# Patient Record
Sex: Male | Born: 1985
Health system: Southern US, Community
[De-identification: ages and names within clinical notes are randomized; demographics above are authoritative.]

## PROBLEM LIST (undated history)

## (undated) DIAGNOSIS — F410 Panic disorder [episodic paroxysmal anxiety] without agoraphobia: Secondary | ICD-10-CM

## (undated) HISTORY — PX: FRACTURE SURGERY: SHX138

## (undated) HISTORY — PX: TYMPANOSTOMY TUBE PLACEMENT: SHX32

## (undated) HISTORY — PX: RHINOPLASTY: SUR1284

---
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
## 2011-03-14 ENCOUNTER — Ambulatory Visit (INDEPENDENT_AMBULATORY_CARE_PROVIDER_SITE_OTHER): Payer: BC Managed Care – PPO | Admitting: Family Medicine

## 2011-03-14 ENCOUNTER — Encounter: Payer: Self-pay | Admitting: Family Medicine

## 2011-03-14 DIAGNOSIS — J069 Acute upper respiratory infection, unspecified: Secondary | ICD-10-CM

## 2011-03-14 MED ORDER — AZITHROMYCIN 250 MG PO TABS: ORAL_TABLET | ORAL | Status: DC

## 2011-03-14 MED ORDER — AZITHROMYCIN 250 MG PO TABS: ORAL_TABLET | ORAL | Status: AC

## 2011-03-14 NOTE — Assessment & Plan Note (Signed)
Discussed supportive care to include antihistamine with decongestant, nasal saline rinse for cough which seems to be triggered by postnasal drainage. We discussed that this is likely viral in etiology and is not likely to be helped by antibiotics at this point. Gave him a prescription for azithromycin to hold on to and if his symptoms worsen or duration is longer than 7-10 days may fill at that time.  Patient not a candidate for flu vaccine do to history of chicken allergy which he was told precluded him from getting a flu shot due to anaphylaxis

## 2011-03-14 NOTE — Patient Instructions (Signed)
Pseudoephedrine for decongestant Zyrtec for allergy Nasal saline rinse  Reserve azithromycin if no improvement.  Your upper respiratory is likely caused by a virus that doesn't respond to antibiotics- it will run its course in 1 week.

## 2011-03-14 NOTE — Progress Notes (Signed)
  Subjective:    Patient ID: Crixus Mcaulay, male    DOB: 11/10/1985, 25 y.o.   MRN: [id number]  HPI 25 year old new patient seen as a work in for acute URI  2 day history of fever of 103, rhinorrhea, cough.  Has been going to work without significant fatigue or myalgias. Cough has some element of postnasal drainage and feels he has some chest congestion. No dyspnea. + mild sore throat when coughing.  Wife had same illness several days ago.  Past medical history significant for asthma as a child but has not needed any treatment since adulthood Review of SystemsGeneral:  Negative for fever, chills, malaise, myalgias HEENT: Negative for conjunctivitis, ear pain or drainage, rhinorrhea,  Respiratory:  Negative for dyspnea Abdomen: Negative for abdominal pain, emesis, diarrhea Skin:  Negative for rash         Objective:   Physical Exam  GEN: Alert & Oriented, No acute distress HEENT: [place]/AT. EOMI, PERRLA, no conjunctival injection or scleral icterus.  Bilateral tympanic membranes intact without erythema or effusion.  .  Nares without edema or rhinorrhea.  Oropharynx is with minimal erythema and no exudates.  No anterior or posterior cervical lymphadenopathy. CV:  Regular Rate & Rhythm, no murmur Respiratory:  Normal work of breathing, CTAB        Assessment & Plan:

## 2011-07-20 ENCOUNTER — Ambulatory Visit (INDEPENDENT_AMBULATORY_CARE_PROVIDER_SITE_OTHER): Payer: BC Managed Care – PPO | Admitting: Family Medicine

## 2011-07-20 ENCOUNTER — Encounter: Payer: Self-pay | Admitting: Family Medicine

## 2011-07-20 DIAGNOSIS — J329 Chronic sinusitis, unspecified: Secondary | ICD-10-CM

## 2011-07-20 MED ORDER — FLUTICASONE PROPIONATE 50 MCG/ACT NA SUSP: 2.0000 | Freq: Every day | NASAL | Status: DC

## 2011-07-20 MED ORDER — CETIRIZINE HCL 10 MG PO TABS: 10.0000 mg | ORAL_TABLET | Freq: Every day | ORAL | Status: DC

## 2011-07-20 MED ORDER — MONTELUKAST SODIUM 10 MG PO TABS: 10.0000 mg | ORAL_TABLET | Freq: Every day | ORAL | Status: DC

## 2011-07-20 NOTE — Progress Notes (Signed)
  Subjective:    Patient ID: Billy Potter, male    DOB: 1985-06-20, 26 y.o.   MRN: [id number]  HPI Any 26-year-old male in no significant past medical history since December has had bouts of chronic sinusitis. Patient has had 5 different rounds of antibiotics including azithromycin, amoxicillin and Augmentin. Patient has been attempting to take Zyrtec 10 mg at night if no improvement. Patient states that the congestion seems to be getting worse especially at night. Patient denies any fevers or fatigue, any vision changes or hearing problems, or trouble swallowing. Patient does state he does seem to have an increase in appetite of late. Patient does have a family history of chronic sinus problems.   Review of Systems As stated above in history of present illness    Objective:   Physical Exam vitals reviewed. General: No apparent distress healthy male HEENT: Pupils equal round reactive to light, extraocular movements intact, no injection of the sclera. Tympanic membranes intact bilaterally no fluid noted behind the ears Nares patent patient though has significant swelling in erythema of the turbinates bilaterally minimal septal deviation to the right Positive postnasal drip. Mild anterior cervical lymphadenopathy. Cardiovascular: Regular rate and rhythm Pulmonary: Clear to auscultation bilaterally Skin intact no rashes.    Assessment & Plan:

## 2011-07-20 NOTE — Patient Instructions (Signed)
I am giving you to new medications that have sent into your pharmacy. One is in no spray called Flonase. Do one spray in each nostril daily I also in giving you a pill called Singulair and that you'll take at night. Continue the [employer] in the zyrtec. I when she to come back in one month and we'll see if this worked.

## 2011-07-20 NOTE — Assessment & Plan Note (Addendum)
Patient gives a good history of 3 months duration of chronic sinusitis treated with 5 different rounds of antibiotics with no improvement. Patient states intermittent decrease in symptoms but never without complete relief. Patient has not changed environments and does not seem to no specific triggers. At this time feel that this could be more secondary to the inflammation do not see any signs of infection at this time. We will treat as allergic rhinitis with Flonase and Singulair, we'll do one month duration it does not improve then we will consider treating for potential fungal infection as well as potential imaging in potential referral to ENT. Of note patient's mother did have fungal infection it was hard to treat per ENT and seen on MRI. If patient is not improved would consider same route of treatment.

## 2011-08-03 ENCOUNTER — Encounter: Payer: Self-pay | Admitting: Family Medicine

## 2011-08-03 ENCOUNTER — Ambulatory Visit (INDEPENDENT_AMBULATORY_CARE_PROVIDER_SITE_OTHER): Payer: BC Managed Care – PPO | Admitting: Family Medicine

## 2011-08-03 VITALS — BP 132/80 | HR 89 | Ht 66.0 in | Wt 162.0 lb

## 2011-08-03 DIAGNOSIS — F39 Unspecified mood [affective] disorder: Secondary | ICD-10-CM

## 2011-08-03 LAB — COMPREHENSIVE METABOLIC PANEL
ALT: 15 U/L (ref 0–53)
AST: 18 U/L (ref 0–37)
Alkaline Phosphatase: 58 U/L (ref 39–117)
Creat: 0.98 mg/dL (ref 0.50–1.35)
Sodium: 140 mEq/L (ref 135–145)
Total Bilirubin: 1.4 mg/dL — ABNORMAL HIGH (ref 0.3–1.2)
Total Protein: 7.4 g/dL (ref 6.0–8.3)

## 2011-08-03 LAB — TSH: TSH: 1.049 u[IU]/mL (ref 0.350–4.500)

## 2011-08-03 LAB — LIPID PANEL
HDL: 63 mg/dL (ref >39)
LDL Cholesterol: 145 mg/dL — ABNORMAL HIGH (ref 0–99)
Total CHOL/HDL Ratio: 3.5 Ratio
VLDL: 14 mg/dL (ref 0–40)

## 2011-08-03 LAB — CBC
MCH: 30.5 pg (ref 26.0–34.0)
MCHC: 34.5 g/dL (ref 30.0–36.0)
MCV: 88.5 fL (ref 78.0–100.0)
Platelets: 211 10*3/uL (ref 150–400)
RDW: 13.2 % (ref 11.5–15.5)

## 2011-08-03 MED ORDER — QUETIAPINE FUMARATE 100 MG PO TABS: 50.0000 mg | ORAL_TABLET | Freq: Every day | ORAL | Status: DC

## 2011-08-03 NOTE — Assessment & Plan Note (Addendum)
New dx: Likley Bipolar Disorder with significant elements of irritability and anxiety.   Discussed approach to treatment- Offered treatment by myself or psychiatry- he declines psychiatry referral.  Encouraged therapy- declines at this time- open to discussing in the future.  Will start seroquel- titration up to 300 mg, follow-up in 2 weeks.  Discussed risk of mood changes, weight gain, red flags to call.  TSH, CBC, CMET, lipids obtained today as baseline and to evaluate endocrine/metabolic contributors.

## 2011-08-03 NOTE — Progress Notes (Signed)
  Subjective:    Patient ID: Billy Potter, male    DOB: [birth year], 26 y.o.   MRN: [id number]  HPI Here to discuss "stress."  Has dealt with increased stress for past 2 years, for the past several months symptoms have become worse.  Primary symptoms are irritability, insomnia. Has missed 14 days of work this year, which is very atypical- had previously not ever taken a sick day and would like to seek help.  Notes he becomes easily irritated and stay irritated longer than he feels is appropriate.  Stay awake at night- watching tv and cleaning" - does not feel sleepy and can stay up all night.  Has noticed decreased concentration- used to do woodworking for stress relief, now notes he makes many mistakes.  He notes his labile moods are worsened by stress at work and home, but also recognizes that his irritability and approach to conflict  have also damaged these relationships.  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  Notes during a past time of stress- afterwards "crashed for 48 hours"  Recalls some episodes of poor judgement that he did not realize until called by a supervisor later confronted him.  Does not feel fatigue, but often feels little need for sleep.  PMH sig for "panic attacks" treated from about 8th -12th grade by zoloft by a psychiatrist/therapist.  Was effective, but made him feel "numb"   States he did not have trouble during college years.    FH: Mom panic, on zoloft Dad: "worries" on zoloft Sister: panic- on zoloft Sister #2: ? dx Review of Systems Denies history of or current alcohol abuse, illicit drug use.  Does associate smoking as a stress  reliever for him.   No caffeine use.  No SI, HI.    Objective:   Physical Exam  Psychiatric: His speech is normal and behavior is normal. Judgment normal. His mood appears anxious. Thought content is not paranoid and not delusional. Cognition and memory are normal. He expresses no homicidal and no suicidal ideation.          Assessment & Plan:

## 2011-08-03 NOTE — Patient Instructions (Signed)
Take half tablet on night 1 100 mg (1 tablet) on night 2 200 mg (2 tablets) on night 3 300 mg (3 tablets) nightly thereafter  Return for follow-up in 2-3 weeks

## 2011-08-04 ENCOUNTER — Encounter: Payer: Self-pay | Admitting: Family Medicine

## 2011-08-31 ENCOUNTER — Telehealth: Payer: Self-pay | Admitting: Family Medicine

## 2011-08-31 NOTE — Telephone Encounter (Signed)
Left message on voicemail (heather) calling to see how doing and if had any questions.  Can call back as needed.

## 2012-02-13 ENCOUNTER — Other Ambulatory Visit: Payer: Self-pay | Admitting: *Deleted

## 2012-02-14 MED ORDER — MONTELUKAST SODIUM 10 MG PO TABS: 10.0000 mg | ORAL_TABLET | Freq: Every day | ORAL | Status: DC

## 2012-02-14 MED ORDER — CETIRIZINE HCL 10 MG PO TABS: 10.0000 mg | ORAL_TABLET | Freq: Every day | ORAL | Status: DC

## 2012-02-14 MED ORDER — FLUTICASONE PROPIONATE 50 MCG/ACT NA SUSP: 2.0000 | Freq: Every day | NASAL | Status: AC

## 2012-04-17 ENCOUNTER — Emergency Department (HOSPITAL_COMMUNITY)
Admission: EM | Admit: 2012-04-17 | Discharge: 2012-04-17 | Disposition: A | Discharge status: Home / Self Care | Payer: 59 | Attending: Emergency Medicine | Admitting: Emergency Medicine

## 2012-04-17 ENCOUNTER — Encounter (HOSPITAL_COMMUNITY): Payer: Self-pay | Admitting: Emergency Medicine

## 2012-04-17 ENCOUNTER — Ambulatory Visit (INDEPENDENT_AMBULATORY_CARE_PROVIDER_SITE_OTHER): Payer: 59 | Admitting: Family Medicine

## 2012-04-17 ENCOUNTER — Emergency Department (HOSPITAL_COMMUNITY): Payer: 59

## 2012-04-17 DIAGNOSIS — Z9889 Other specified postprocedural states: Secondary | ICD-10-CM | POA: Insufficient documentation

## 2012-04-17 DIAGNOSIS — IMO0002 Reserved for concepts with insufficient information to code with codable children: Secondary | ICD-10-CM | POA: Insufficient documentation

## 2012-04-17 DIAGNOSIS — J45909 Unspecified asthma, uncomplicated: Secondary | ICD-10-CM | POA: Insufficient documentation

## 2012-04-17 DIAGNOSIS — J029 Acute pharyngitis, unspecified: Secondary | ICD-10-CM

## 2012-04-17 DIAGNOSIS — J039 Acute tonsillitis, unspecified: Secondary | ICD-10-CM | POA: Insufficient documentation

## 2012-04-17 DIAGNOSIS — T782XXA Anaphylactic shock, unspecified, initial encounter: Secondary | ICD-10-CM

## 2012-04-17 DIAGNOSIS — F17200 Nicotine dependence, unspecified, uncomplicated: Secondary | ICD-10-CM | POA: Insufficient documentation

## 2012-04-17 HISTORY — DX: Panic disorder (episodic paroxysmal anxiety): F41.0

## 2012-04-17 MED ORDER — SODIUM CHLORIDE 0.9 % IV SOLN
INTRAVENOUS | Status: DC
  Administered 2012-04-17: 20 mL/h via INTRAVENOUS

## 2012-04-17 MED ORDER — MORPHINE SULFATE 10 MG/ML IJ SOLN
1.0000 mg | Freq: Once | INTRAMUSCULAR | Status: AC
  Administered 2012-04-17: 1 mg via INTRAVENOUS

## 2012-04-17 MED ORDER — DEXAMETHASONE SODIUM PHOSPHATE 4 MG/ML IJ SOLN
4.0000 mg | Freq: Once | INTRAMUSCULAR | Status: AC
  Administered 2012-04-17: 4 mg via INTRAMUSCULAR

## 2012-04-17 MED ORDER — OXYCODONE-ACETAMINOPHEN 5-325 MG PO TABS: 2.0000 | ORAL_TABLET | ORAL | Status: DC | PRN

## 2012-04-17 MED ORDER — SODIUM CHLORIDE 0.9 % IV BOLUS (SEPSIS)
1000.0000 mL | Freq: Once | INTRAVENOUS | Status: AC
  Administered 2012-04-17: 1000 mL via INTRAVENOUS

## 2012-04-17 MED ORDER — MORPHINE SULFATE 4 MG/ML IJ SOLN
4.0000 mg | Freq: Once | INTRAMUSCULAR | Status: AC
  Administered 2012-04-17: 4 mg via INTRAVENOUS
  Filled 2012-04-17: qty 1

## 2012-04-17 MED ORDER — IOHEXOL 300 MG/ML  SOLN
75.0000 mL | Freq: Once | INTRAMUSCULAR | Status: AC | PRN
  Administered 2012-04-17: 75 mL via INTRAVENOUS

## 2012-04-17 MED ORDER — PENICILLIN G BENZATHINE 1200000 UNIT/2ML IM SUSP
1.2000 10*6.[IU] | Freq: Once | INTRAMUSCULAR | Status: AC
  Administered 2012-04-17: 1.2 10*6.[IU] via INTRAMUSCULAR
  Filled 2012-04-17: qty 2

## 2012-04-17 MED ORDER — ONDANSETRON HCL 4 MG/2ML IJ SOLN
4.0000 mg | Freq: Once | INTRAMUSCULAR | Status: AC
  Administered 2012-04-17: 4 mg via INTRAVENOUS
  Filled 2012-04-17: qty 2

## 2012-04-17 MED ORDER — MORPHINE SULFATE 10 MG/5ML PO SOLN: 1.0000 mg | Freq: Once | ORAL | Status: DC

## 2012-04-17 MED ORDER — KETOROLAC TROMETHAMINE 60 MG/2ML IM SOLN
60.0000 mg | Freq: Once | INTRAMUSCULAR | Status: AC
  Administered 2012-04-17: 60 mg via INTRAMUSCULAR

## 2012-04-17 NOTE — ED Notes (Signed)
Pt states his pain is at 6/10

## 2012-04-17 NOTE — ED Notes (Signed)
Pt laughing and talking with mother, states he feels much better. Is awaiting CT scan

## 2012-04-17 NOTE — ED Notes (Signed)
Visitors at bedside.

## 2012-04-17 NOTE — Addendum Note (Signed)
Addended by: Altamese Dilling A on: 04/17/2012 05:47 PM   Modules accepted: Orders

## 2012-04-17 NOTE — ED Notes (Signed)
Pt c/o tongue pain. Reports the pain is much better than it was this morning. Denies SOB. Pt able to talk in full sentences, no visible swelling to tongue.

## 2012-04-17 NOTE — ED Notes (Signed)
Pt remains in Cat scan

## 2012-04-17 NOTE — ED Notes (Signed)
Pt to ct scan. Mother to cafeteria to get breakfast

## 2012-04-17 NOTE — ED Notes (Signed)
Seen at urgent care yesterday and sent this am from family practice this am for inability to swallow.

## 2012-04-17 NOTE — ED Notes (Signed)
Pt undressed, in gown, on continuous pulse oximetry and blood pressure cuff; vitals taken

## 2012-04-17 NOTE — ED Notes (Signed)
Pt c/o left ear pain rates at 7/10

## 2012-04-17 NOTE — ED Notes (Signed)
Pt states that he is feeling better. States that he can now talk and that he does not feel as scared now.

## 2012-04-17 NOTE — ED Notes (Signed)
Morphine  decadron Toradol All given at family practice pta

## 2012-04-17 NOTE — ED Notes (Signed)
Pt states his pain is in his tongue ( a sharp pain)

## 2012-04-17 NOTE — Progress Notes (Signed)
Patient ID: Billy Potter, male   DOB: September 11, 1985, 27 y.o.   MRN: [id number] Sore throat yesterday--neg strep at urgent Care  Worse overnight. Sig left ear pain and feels like tongue swelling  Seen this am in clinic---acutely ill. Given 30 mg toradol 4 mg decadron Im 1 mg morphone  OP very red---difficult to see seicondary to trismus. No obvious lesions or exudate. Patient in obvious distress--complaining of feeling like tongue swelling. No wheezing. Very frightened. Mom is with him. L TM very retracted (possibly ready to rupture) Skin without rash uanble to get temp Allergy:ANAPHYLAXIS to Chicken and has not had any chicken products that he is awareof Also allergoc sul;fa Current Outpatient Prescriptions on File Prior to Visit  Medication Sig Dispense Refill  . cetirizine (ZYRTEC) 10 MG tablet Take 1 tablet (10 mg total) by mouth daily.  30 tablet  3  . fluticasone (FLONASE) 50 MCG/ACT nasal spray Place 2 sprays into the nose daily.  16 g  6  . ibuprofen (ADVIL,MOTRIN) 200 MG tablet Take 200 mg by mouth every 6 (six) hours as needed.        . montelukast (SINGULAIR) 10 MG tablet Take 1 tablet (10 mg total) by mouth at bedtime.  30 tablet  3  . QUEtiapine (SEROQUEL) 100 MG tablet Take 0.5-3 tablets (50-300 mg total) by mouth at bedtime.  90 tablet  0    Past Medical History  Diagnosis Date  . Asthma     As a child   PSHx: lipoma removal SH no tobacco presently Married  2 children  A/P: Acute pharyngitis No sign anaphylaxis Could still be strep despite neg test at [facility] yesterday. I cannot see OP due to trismus so we have called EMS for transport to ED IV started her in clinic

## 2012-04-17 NOTE — ED Provider Notes (Signed)
Medical screening examination/treatment/procedure(s) were performed by non-physician practitioner and as supervising physician I was immediately available for consultation/collaboration.   Charles B. Bernette Mayers, MD 04/17/12 1209

## 2012-04-17 NOTE — ED Notes (Signed)
Mother to bedside.

## 2012-04-17 NOTE — ED Notes (Signed)
Patient is resting comfortably. 

## 2012-04-17 NOTE — ED Provider Notes (Signed)
History     CSN: [id number]  Arrival date & time 04/17/12  4098   First MD Initiated Contact with Patient 04/17/12 [phone]      Chief Complaint  Patient presents with  . Dysphagia    (Consider location/radiation/quality/duration/timing/severity/associated sxs/prior treatment) HPI Comments: Patient is a 27 year old male who presents with a 2 day history of sore throat. Patient reports gradual onset and progressively worsening sharp, severe throat pain. The pain is constant and made worse with swallowing. The pain is localized to the patient's throat and more notable on the right side.Patient was seen at urgent care yesterday where he had a negative strep test. Nothing alleviates the pain. The patient has tried decadron, morphine, and toradol for symptom relief which was given to him at his PCP office. Patient reports associated cervical adenopathy and difficulty swallowing. Patient denies headache, visual changes, sinus congestion, difficulty breathing, chest pain, SOB, abdominal pain, NVD.      Past Medical History  Diagnosis Date  . Asthma     As a child    Past Surgical History  Procedure Date  . Fracture surgery   . Tympanostomy tube placement   . Knee arthroscopy 2001  . Rhinoplasty     Family History  Problem Relation Age of Onset  . Hyperlipidemia Mother   . Kidney disease Mother   . Anxiety disorder Mother   . Cancer Maternal Grandmother   . Pulmonary fibrosis Father   . Anxiety disorder Father   . Anxiety disorder Sister   . Anxiety disorder Sister     History  Substance Use Topics  . Smoking status: Current Every Day Smoker -- 0.6 packs/day    Types: Cigarettes    Last Attempt to Quit: 03/29/2009  . Smokeless tobacco: Not on file  . Alcohol Use: No      Review of Systems  HENT: Positive for sore throat and trouble swallowing.   All other systems reviewed and are negative.    Allergies  Chicken allergy and Sulfa antibiotics  Home Medications    Current Outpatient Rx  Name  Route  Sig  Dispense  Refill  . CETIRIZINE HCL 10 MG PO TABS   Oral   Take 1 tablet (10 mg total) by mouth daily.   30 tablet   3   . FLUTICASONE PROPIONATE 50 MCG/ACT NA SUSP   Nasal   Place 2 sprays into the nose daily.   16 g   6   . IBUPROFEN 200 MG PO TABS   Oral   Take 200 mg by mouth every 6 (six) hours as needed.          Marland Kitchen MONTELUKAST SODIUM 10 MG PO TABS   Oral   Take 1 tablet (10 mg total) by mouth at bedtime.   30 tablet   3   . QUETIAPINE FUMARATE 100 MG PO TABS   Oral   Take 0.5-3 tablets (50-300 mg total) by mouth at bedtime.   90 tablet   0     BP 92/64  Pulse 82  Temp 98.3 F (36.8 C) (Oral)  Resp 22  SpO2 100%  Physical Exam  Nursing note and vitals reviewed. Constitutional: He is oriented to person, place, and time. He appears well-developed and well-nourished. No distress.  HENT:  Head: Normocephalic and atraumatic.  Nose: Nose normal.       Patient experiencing trismus so I could not see his posterior pharynx very well however there does appear to be  some erythema without notable swelling.   Eyes: Conjunctivae normal and EOM are normal. Pupils are equal, round, and reactive to light. No scleral icterus.  Neck:       Neck tender to palpation. No swelling appreciated. ROM limited due to pain.   Cardiovascular: Normal rate and regular rhythm.  Exam reveals no gallop and no friction rub.   No murmur heard. Pulmonary/Chest: Effort normal and breath sounds normal. He has no wheezes. He has no rales. He exhibits no tenderness.  Abdominal: Soft. He exhibits no distension. There is no tenderness. There is no rebound.  Musculoskeletal: Normal range of motion.  Neurological: He is alert and oriented to person, place, and time. Coordination abnormal.       Speech is goal-oriented. Moves limbs without ataxia.   Skin: Skin is warm and dry. He is not diaphoretic.  Psychiatric: He has a normal mood and affect. His  behavior is normal.    ED Course  Procedures (including critical care time)  Labs Reviewed - No data to display Ct Soft Tissue Neck W Contrast  04/17/2012  *RADIOLOGY REPORT*  Clinical Data: Sore throat.   Left ear pain.  Sensation of tongue swelling.  CT NECK WITH CONTRAST  Technique:  Multidetector CT imaging of the neck was performed with intravenous contrast.  Contrast: 75mL OMNIPAQUE IOHEXOL 300 MG/ML  SOLN  Comparison: None.  Findings: Mild prominence of the palatine tonsils minimally asymmetric and slightly greater on the left suggestive of inflammation without abscess or breakthrough into the parapharyngeal space.  Increased lymph nodes most likely reactive in origin.  Visualized intracranial structures unremarkable.  No primary neck mass identified.  Lung apices are clear.  Major intracranial vascular structures are patent.  Mild fatty subcutaneous stranding posterior dependent region. Significance indeterminate.  IMPRESSION:  Mild prominence of the palatine tonsils minimally asymmetric and slightly greater on the left suggestive of inflammation without abscess or breakthrough into the parapharyngeal space.  Increased lymph nodes most likely reactive in origin.   Original Report Authenticated By: Lacy Duverney, M.D.      1. Tonsillitis       MDM  9:30 AM Patient will have CT neck with contrast to evaluated for peritonsillar abscess. Patient came from PCP where he received IV decadron, morphine and toradol. BP slightly low so I will give him fluids.   12:03 PM CT neck shows tonsillitis. Patient will have 1.2 million units Pen G and be discharged with Percocet for pain. Patient reports some relief with morphine here. Vitals are stable. Patient is afebrile. Patient instructed to return with worsening or concerning symptoms. No further evaluation needed at this time.       Emilia Beck, [place] 04/17/12 1207

## 2012-04-26 ENCOUNTER — Other Ambulatory Visit: Payer: Self-pay | Admitting: Family Medicine

## 2012-04-26 MED ORDER — AMOXICILLIN-POT CLAVULANATE 875-125 MG PO TABS: 1.0000 | ORAL_TABLET | Freq: Two times a day (BID) | ORAL | Status: DC

## 2012-04-26 NOTE — Progress Notes (Signed)
Never has gotten completely over pharyngittis---sore thropat better but getting increasing sinus pain (similar to a prev documented sinus infection). Will treat with  augmentin (sulfa allergy)and if not better in2 wk will f/u w me. I suspect he may need tonsillectomy ultimately  Billy Potter

## 2013-01-31 ENCOUNTER — Ambulatory Visit (INDEPENDENT_AMBULATORY_CARE_PROVIDER_SITE_OTHER): Payer: 59 | Admitting: Family Medicine

## 2013-01-31 ENCOUNTER — Encounter: Payer: Self-pay | Admitting: Family Medicine

## 2013-01-31 VITALS — BP 121/80 | HR 74 | Temp 97.8°F | Wt 171.0 lb

## 2013-01-31 DIAGNOSIS — J111 Influenza due to unidentified influenza virus with other respiratory manifestations: Secondary | ICD-10-CM | POA: Insufficient documentation

## 2013-01-31 MED ORDER — AMOXICILLIN-POT CLAVULANATE 875-125 MG PO TABS: 1.0000 | ORAL_TABLET | Freq: Two times a day (BID) | ORAL | Status: DC

## 2013-01-31 MED ORDER — OSELTAMIVIR PHOSPHATE 75 MG PO CAPS: 75.0000 mg | ORAL_CAPSULE | Freq: Two times a day (BID) | ORAL | Status: DC

## 2013-01-31 NOTE — Assessment & Plan Note (Signed)
Patient with acute onset fever, cough and congestion. Most likely early flu-like illness given his known sick contacts. Will give Tamiflu since he is within hours of onset. Given information on influenza, and he should anticipate to feel slightly worse before he feels better. Rest, fluids and good hand hygiene. However, since patient has history of chronic sinusitis especially after URI, printed Rx for Augmentin for him to take only if he feels like his head is getting worse and he has a sinus infection. Patient is reliable and agrees.

## 2013-01-31 NOTE — Patient Instructions (Signed)
Influenza Facts  Flu (influenza) is a contagious respiratory illness caused by the influenza viruses. It can cause mild to severe illness. While most healthy people recover from the flu without specific treatment and without complications, older people, young children, and people with certain health conditions are at higher risk for serious complications from the flu, including death.  CAUSES   The flu virus is spread from person to person by respiratory droplets from coughing and sneezing.  A person can also become infected by touching an object or surface with a virus on it and then touching their mouth, eye or nose.  Adults may be able to infect others from 1 day before symptoms occur and up to 7 days after getting sick. So it is possible to give someone the flu even before you know you are sick and continue to infect others while you are sick.  SYMPTOMS   Fever (usually high).  Headache.  Tiredness (can be extreme).  Cough.  Sore throat.  Runny or stuffy nose.  Body aches.  Diarrhea and vomiting may also occur, particularly in children.  These symptoms are referred to as "flu-like symptoms". A lot of different illnesses, including the common cold, can have similar symptoms.  DIAGNOSIS   There are tests that can determine if you have the flu as long you are tested within the first 2 or 3 days of illness.  A doctor's exam and additional tests may be needed to identify if you have a disease that is a complicating the flu.  RISKS AND COMPLICATIONS   Some of the complications caused by the flu include:  Bacterial pneumonia or progressive pneumonia caused by the flu virus.  Loss of body fluids (dehydration).  Worsening of chronic medical conditions, such as heart failure, asthma, or diabetes.  Sinus problems and ear infections.  HOME CARE INSTRUCTIONS   Seek medical care early on.  If you are at high risk from complications of the flu, consult your health-care provider as soon as you develop flu-like symptoms. Those  at high risk for complications include:  People 65 years or older.  People with chronic medical conditions, including diabetes.  Pregnant women.  Young children.  Your caregiver may recommend use of an antiviral medication to help treat the flu.  If you get the flu, get plenty of rest, drink a lot of liquids, and avoid using alcohol and tobacco.  You can take over-the-counter medications to relieve the symptoms of the flu if your caregiver approves. (Never give aspirin to children or teenagers who have flu-like symptoms, particularly fever).  PREVENTION   The single best way to prevent the flu is to get a flu vaccine each fall. Other measures that can help protect against the flu are:  Antiviral Medications  A number of antiviral drugs are approved for use in preventing the flu. These are prescription medications, and a doctor should be consulted before they are used.  Habits for Good Health  Cover your nose and mouth with a tissue when you cough or sneeze, throw the tissue away after you use it.  Wash your hands often with soap and water, especially after you cough or sneeze. If you are not near water, use an alcohol-based hand cleaner.  Avoid people who are sick.  If you get the flu, stay home from work or school. Avoid contact with other people so that you do not make them sick, too.  Try not to touch your eyes, nose, or mouth as germs ore   often spread this way.  IN CHILDREN, EMERGENCY WARNING SIGNS THAT NEED URGENT MEDICAL ATTENTION:  Fast breathing or trouble breathing.  Bluish skin color.  Not drinking enough fluids.  Not waking up or not interacting.  Being so irritable that the child does not want to be held.  Flu-like symptoms improve but then return with fever and worse cough.  Fever with a rash.  IN ADULTS, EMERGENCY WARNING SIGNS THAT NEED URGENT MEDICAL ATTENTION:  Difficulty breathing or shortness of breath.  Pain or pressure in the chest or abdomen.  Sudden dizziness.  Confusion.  Severe or persistent  vomiting.  SEEK IMMEDIATE MEDICAL CARE IF:   You or someone you know is experiencing any of the symptoms above. When you arrive at the emergency center,report that you think you have the flu. You may be asked to wear a mask and/or sit in a secluded area to protect others from getting sick.  MAKE SURE YOU:   Understand these instructions.  Monitor your condition.  Seek medical care if you are getting worse, or not improving.  Document Released: 03/30/2003 Document Revised: 06/19/2011 Document Reviewed: 12/24/2008  Exit[employer]® Patient Information ©2014 [employer].
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
## 2013-12-08 ENCOUNTER — Encounter: Payer: Self-pay | Admitting: Family Medicine

## 2013-12-08 ENCOUNTER — Other Ambulatory Visit: Payer: Self-pay | Admitting: Family Medicine

## 2013-12-08 ENCOUNTER — Ambulatory Visit (INDEPENDENT_AMBULATORY_CARE_PROVIDER_SITE_OTHER): Payer: 59 | Admitting: Family Medicine

## 2013-12-08 VITALS — BP 131/72 | HR 78 | Temp 98.7°F | Ht 66.0 in | Wt 173.1 lb

## 2013-12-08 DIAGNOSIS — Z23 Encounter for immunization: Secondary | ICD-10-CM

## 2013-12-08 DIAGNOSIS — T300 Burn of unspecified body region, unspecified degree: Secondary | ICD-10-CM | POA: Insufficient documentation

## 2013-12-08 MED ORDER — BACITRACIN 500 UNIT/GM EX OINT: 1.0000 "application " | TOPICAL_OINTMENT | Freq: Two times a day (BID) | CUTANEOUS | Status: DC
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
## 2013-12-08 NOTE — Patient Instructions (Signed)
Burn Care Your skin is a natural barrier to infection. It is the largest organ of your body. Burns damage this natural protection. To help prevent infection, it is very important to follow your caregiver's instructions in the care of your burn. Burns are classified as:  First degree. There is only redness of the skin (erythema). No scarring is expected.  Second degree. There is blistering of the skin. Scarring may occur with deeper burns.  Third degree. All layers of the skin are injured, and scarring is expected. HOME CARE INSTRUCTIONS   Wash your hands well before changing your bandage.  Change your bandage as often as directed by your caregiver.  Remove the old bandage. If the bandage sticks, you may soak it off with cool, clean water.  Cleanse the burn thoroughly but gently with mild soap and water.  Pat the area dry with a clean, dry cloth.  Apply a thin layer of antibacterial cream to the burn.  Apply a clean bandage as instructed by your caregiver.  Keep the bandage as clean and dry as possible.  Elevate the affected area for the first 24 hours, then as instructed by your caregiver.  Only take over-the-counter or prescription medicines for pain, discomfort, or fever as directed by your caregiver. SEEK IMMEDIATE MEDICAL CARE IF:   You develop excessive pain.  You develop redness, tenderness, swelling, or red streaks near the burn.  The burned area develops yellowish-white fluid (pus) or a bad smell.  You have a fever. MAKE SURE YOU:   Understand these instructions.  Will watch your condition.  Will get help right away if you are not doing well or get worse. Document Released: 03/27/2005 Document Revised: 06/19/2011 Document Reviewed: 08/17/2010 [facility] Patient Information 2015 [place], [place]. This information is not intended to replace advice given to you by your health care provider. Make sure you discuss any questions you have with your health care  provider.  Follow  Up in 1 week or sooner if  Needed.

## 2013-12-18 ENCOUNTER — Ambulatory Visit: Payer: 59

## 2014-01-18 IMAGING — CT CT NECK W/ CM
4 of 5 series · 15 of 33 positions shown, 17 images · IV contrast (APPLIED)
Comparison: None.

CLINICAL DATA: Sore throat.   Left ear pain.  Sensation of tongue
swelling.

CT NECK WITH CONTRAST
TECHNIQUE: Multidetector CT imaging of the neck was performed with
intravenous contrast.
Contrast: 75mL OMNIPAQUE IOHEXOL 300 MG/ML  SOLN

[Series 3: st neck 2.0 b31s · axial · 0.38mm/px · z∈[-218,-114]mm · 3 of 131 slices shown]
[im 27/131  bone]
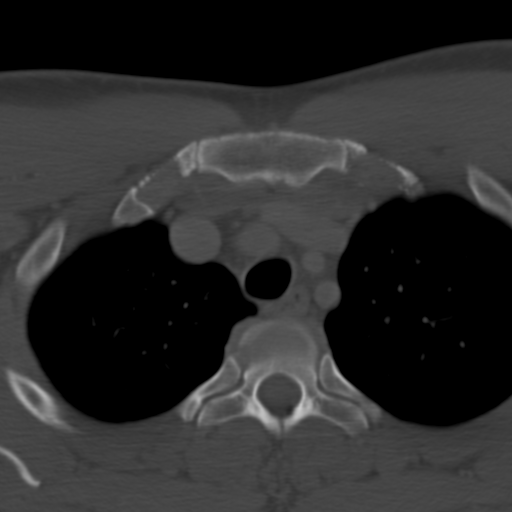
[im 53/131  bone]
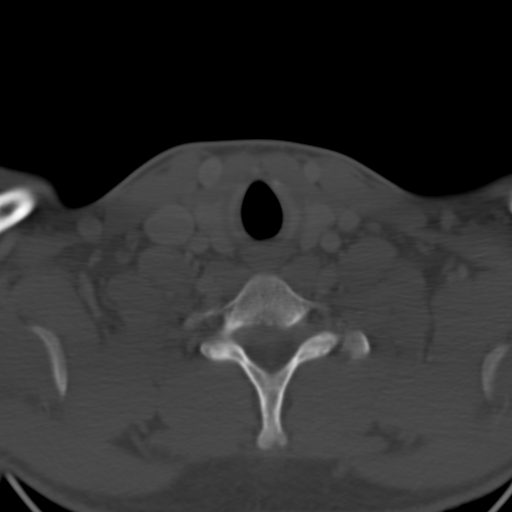
[im 79/131  bone]
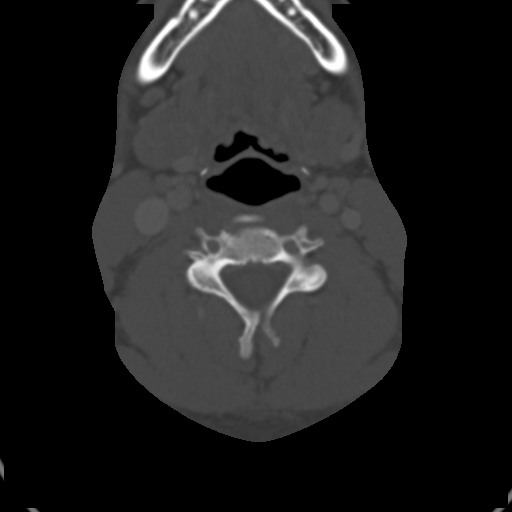

[Series 6: coronals · coronal · 0.38mm/px · 3 of 83 slices shown]
[im 17/83  bone]
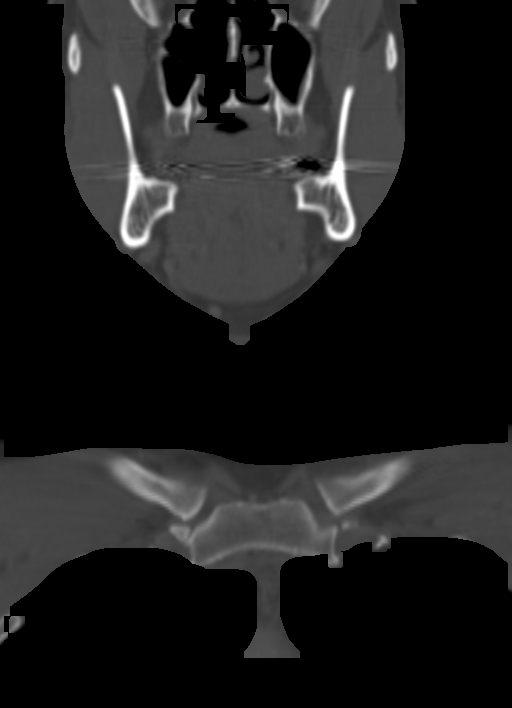
[im 33/83  bone]
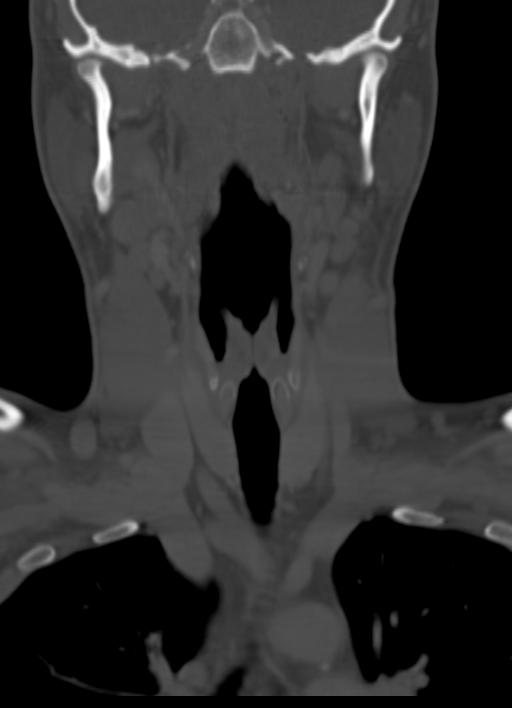
[im 50/83  bone]
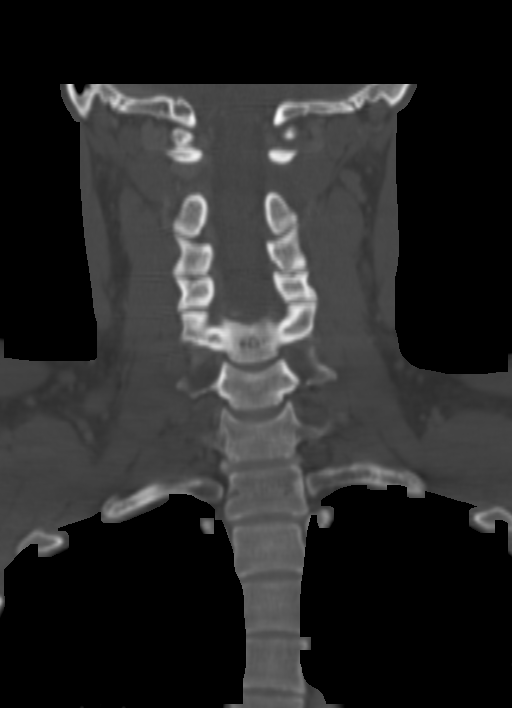

[Series 7: sagittals · sagittal · 0.42mm/px · 5 of 87 slices shown, 6 images]
[im 29/87  bone]
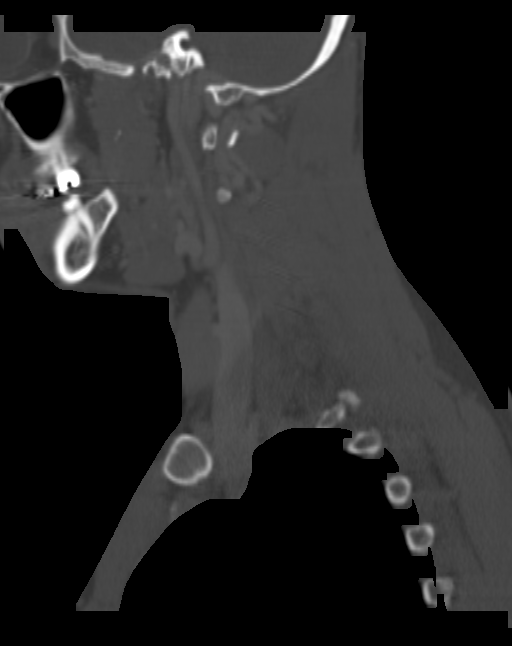
[im 36/87  bone]
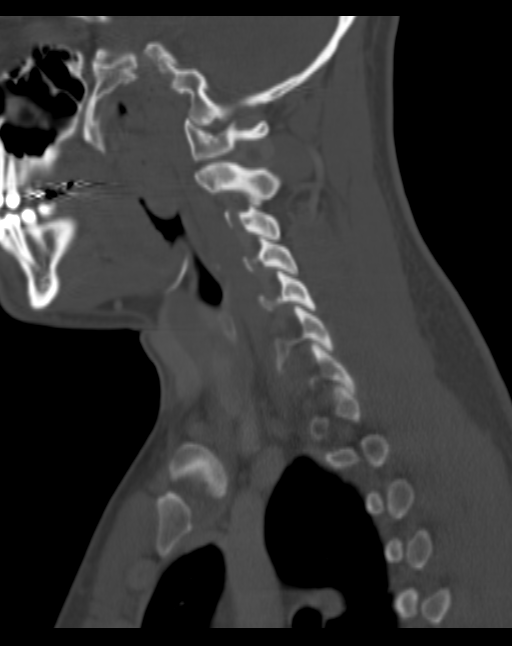
[im 44/87  soft-tissue]
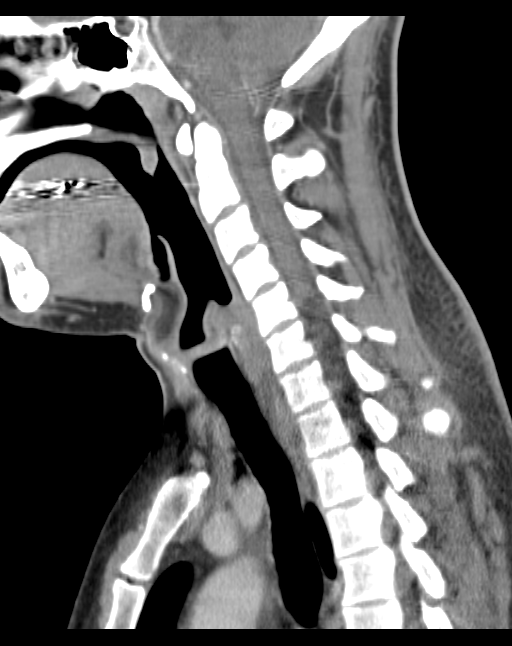
[im 44/87  bone]
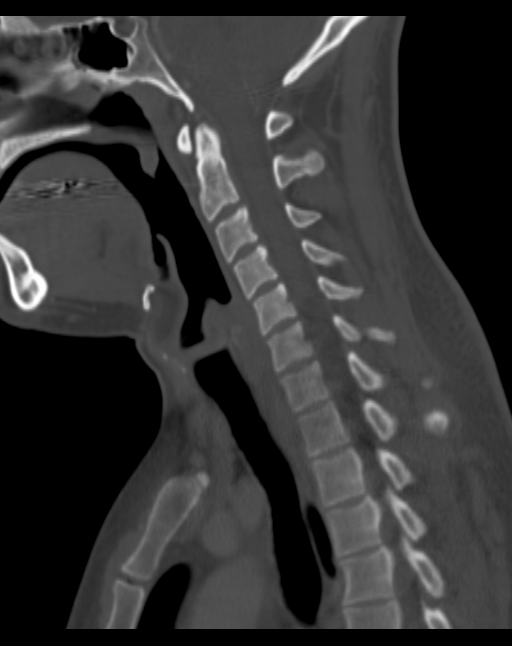
[im 51/87  bone]
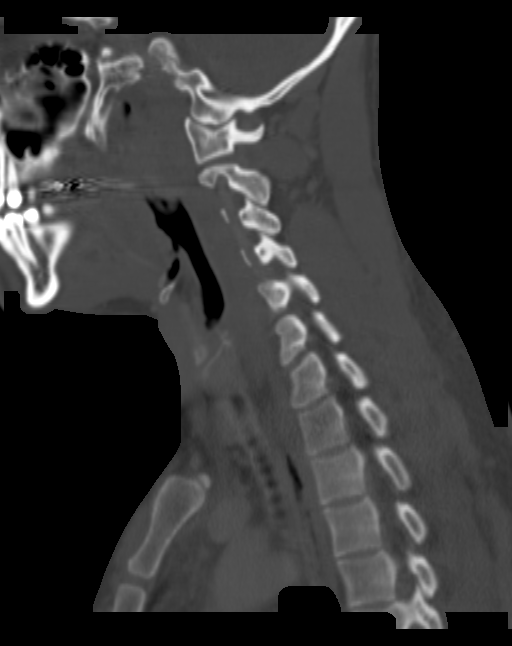
[im 58/87  bone]
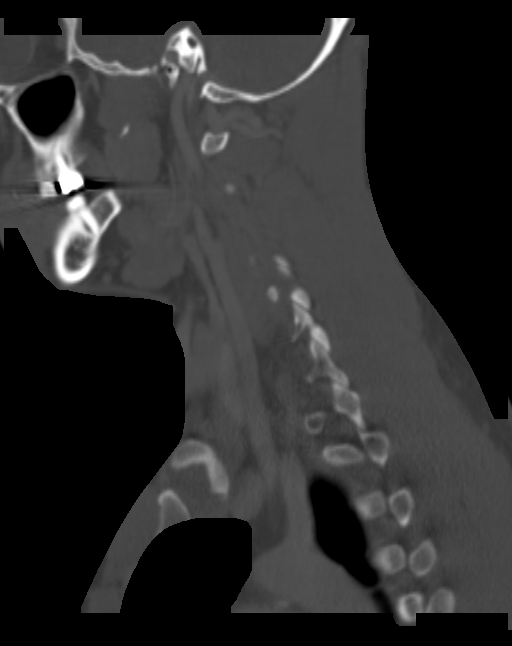

[Series 8: orthogonals · axial · 0.39mm/px · z∈[-247,-92]mm · 4 of 137 slices shown, 5 images]
[im 28/137  soft-tissue]
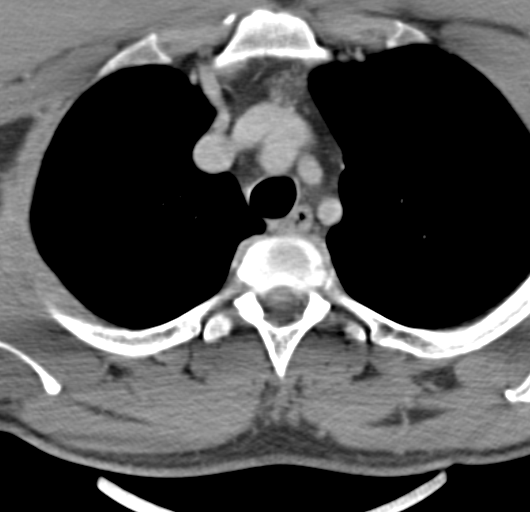
[im 28/137  bone]
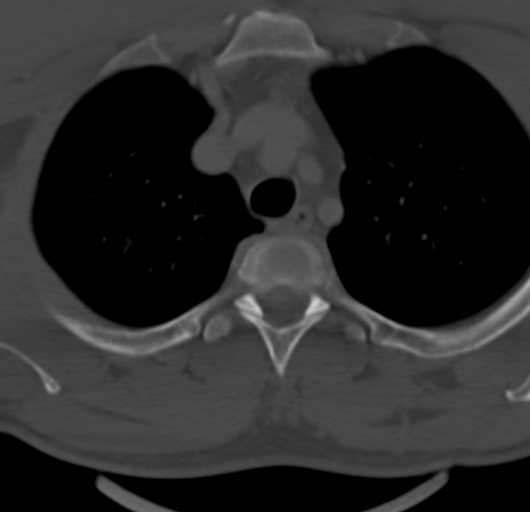
[im 55/137  bone]
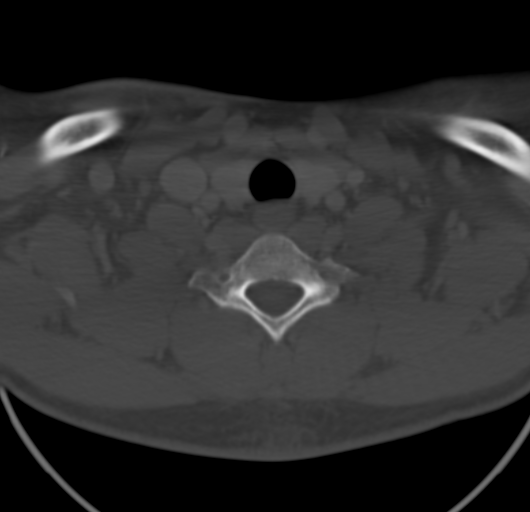
[im 82/137  bone]
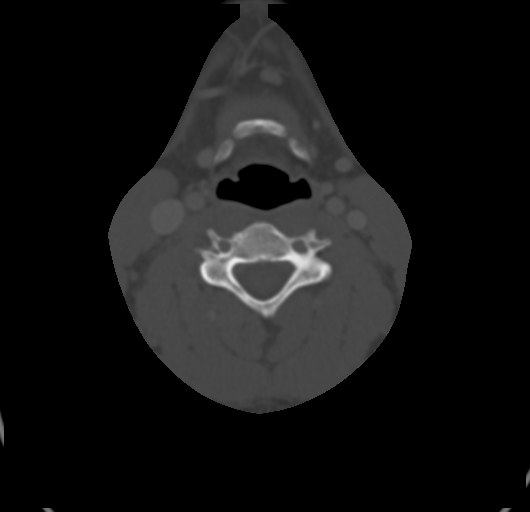
[im 109/137  bone]
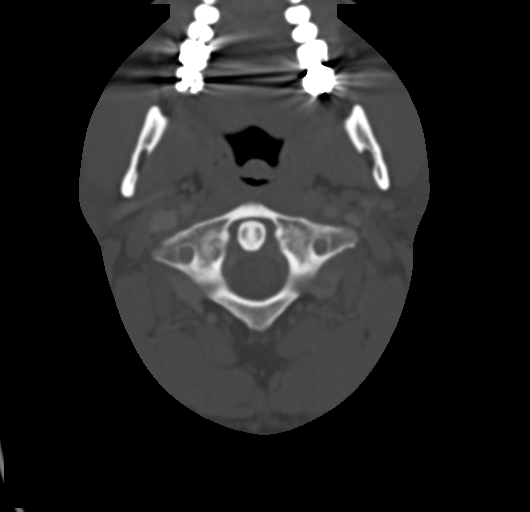

[15 of 33 positions shown; findings below may reference images not displayed]

FINDINGS: Mild prominence of the palatine tonsils minimally
asymmetric and slightly greater on the left suggestive of
inflammation without abscess or breakthrough into the
parapharyngeal space.  Increased lymph nodes most likely reactive
in origin.

Visualized intracranial structures unremarkable.  No primary neck
mass identified.  Lung apices are clear.

Major intracranial vascular structures are patent.

Mild fatty subcutaneous stranding posterior dependent region.
Significance indeterminate.
IMPRESSION: Mild prominence of the palatine tonsils minimally asymmetric and
slightly greater on the left suggestive of inflammation without
abscess or breakthrough into the parapharyngeal space.  Increased
lymph nodes most likely reactive in origin.

## 2014-03-30 ENCOUNTER — Other Ambulatory Visit: Payer: Self-pay | Admitting: *Deleted

## 2014-03-30 ENCOUNTER — Other Ambulatory Visit: Payer: Self-pay | Admitting: Family Medicine

## 2014-03-30 MED ORDER — MONTELUKAST SODIUM 10 MG PO TABS: 10.0000 mg | ORAL_TABLET | Freq: Every day | ORAL | Status: AC

## 2014-03-30 MED ORDER — PREDNISONE 10 MG PO TABS: ORAL_TABLET | ORAL | Status: DC

## 2014-03-30 NOTE — Progress Notes (Signed)
Still having trouble recovering from bronchitis episode started 10 days ago---cough. Had some childhood asthma and he says symptoms are similar. Not SOB per se but cough, non productive.No current fever---may have had one about a week ago. Will do burst steroids (his request) and if not better he will make appt.

## 2017-10-17 ENCOUNTER — Ambulatory Visit (INDEPENDENT_AMBULATORY_CARE_PROVIDER_SITE_OTHER): Payer: 59 | Admitting: Family Medicine

## 2017-10-17 ENCOUNTER — Encounter: Payer: Self-pay | Admitting: Family Medicine

## 2017-10-17 ENCOUNTER — Other Ambulatory Visit: Payer: Self-pay

## 2017-10-17 VITALS — BP 116/76 | HR 78 | Temp 98.6°F | Ht 66.0 in | Wt 169.8 lb

## 2017-10-17 DIAGNOSIS — F411 Generalized anxiety disorder: Secondary | ICD-10-CM | POA: Diagnosis not present

## 2017-10-17 DIAGNOSIS — L255 Unspecified contact dermatitis due to plants, except food: Secondary | ICD-10-CM | POA: Diagnosis not present

## 2017-10-17 DIAGNOSIS — Z0000 Encounter for general adult medical examination without abnormal findings: Secondary | ICD-10-CM | POA: Diagnosis not present

## 2017-10-17 DIAGNOSIS — H9193 Unspecified hearing loss, bilateral: Secondary | ICD-10-CM

## 2017-10-17 DIAGNOSIS — Z0001 Encounter for general adult medical examination with abnormal findings: Secondary | ICD-10-CM

## 2017-10-17 MED ORDER — ESCITALOPRAM OXALATE 10 MG PO TABS: 10.0000 mg | ORAL_TABLET | Freq: Every day | ORAL | 1 refills | Status: DC

## 2017-10-17 MED ORDER — PREDNISONE 10 MG PO TABS: ORAL_TABLET | ORAL | 2 refills | Status: DC

## 2017-10-17 NOTE — Progress Notes (Addendum)
CHIEF COMPLAINT / HPI: Here for checkup.  Has not been to the doctor.  Has a couple of issues. #2.  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  All good news except he finds himself a little irritable most of the time.  Since his frustration and very angry over something small.  He has been working out every morning at 5:00 5 days a week and that has seemed to help.  He has remarried and has a good relationship with both his home life and his work.  He just feels stressed and a little bit on edge.  Sleeps well.  Appetite is good.  Energy level is good. #3.  He thinks his hearing is significantly decreased over the last month he has to turn the TV up extremely comfortable with the volume of the.  He wears ear protection at work most of the time. He has no tinnitus. 4.  In the past, he is a lot of problems with severe contact dermatitis related to poison   He is quite allergic.  He would like to have some prednisone hand.  He does a lot of activities outdoors.  REVIEW OF SYSTEMS: Review of Systems  Constitutional: Negative for activity chang; no  appetite change and no unexpected weight change.  Eyes: Negative for eye pain and no visual disturbance.  Ears: See HPI Neck: denies neck pain; no swallowing problems CV: No chest pain, no shortness of breath, no lower extremity edema. No change in exercise tolerance Respiratory: Negative for cough or wheezing.  No shortness of breath. Gastrointestinal: Negative for abdominal pain, no diarrhea and no  constipation.  Genitourinary: Negative for decreased urine volume and  no difficulty urinating.  Musculoskeletal: Negative for arthralgias. No muscle weakness. Skin: Negative for rash.  Psychiatric/Behavioral: see HPI   PERTINENT  PMH / PSH: I have reviewed the patient's medications, allergies, past medical and surgical history, smoking status and updated in the EMR as appropriate. Nonsmoker Hx of mildly elevated  cholesterol History of ADHD as a child  OBJECTIVE:  Vital signs reviewed GENERALl: Well developed, well nourished, in no acute distress. HEENT: PERRLA, EOMI, sclerae are nonicteric NECK: Supple, FROM, without lymphadenopathy.  TMs bilaterally are normal in appearance.  There are mobile.  Gross hearing is intact. THYROID: normal without nodularity CAROTID ARTERIES: without bruits LUNGS: clear to auscultation bilaterally. No wheezes or rales. Normal respiratory effort HEART: Regular rate and rhythm, no murmurs. Distal pulses are bilaterally symmetrical, 2+. ABDOMEN: soft with positive bowel sounds. No masses noted MSK: MOE x 4. Normal muscle strength, bulk and tone. SKIN no rash. Normal temperature. NEURO: no focal deficits. Normal gait. Normal balance. PSYCH: Alert and oriented.  Affect is interactive.  Judgment is normal.  Recent remote memory.  Speech is normal fluency.  No hallucinations.  No suicidal or homicidal ideation.  ASSESSMENT / PLAN:  Anxiety state Discussed options and symptoms.  We will start on citalopram.  Follow-up 4 weeks.  Well adult health check Labs today.  He is exercising regularly.  Discussed health maintenance items and healthy lifestyle modifications; in general he is doing very well.  Hearing decreased, bilateral Referral for formal audiology.  Contact dermatitis due to plants, except food I will give him a prescription for oral prednisone for next episode of contact dermatitis has had this many times in the past he has a pretty severe reaction to the think is reasonable to he knows the red flags to watch out for that  would need urgent medical attention.

## 2017-10-17 NOTE — Patient Instructions (Signed)
Let me see you back in 4-6 weeks Please call me if anything gets worse before I see you back or if you have medication problems Great to see yoU!

## 2017-10-18 LAB — BASIC METABOLIC PANEL
BUN / CREAT RATIO: 11 (ref 9–20)
BUN: 11 mg/dL (ref 6–20)
CO2: 24 mmol/L (ref 20–29)
CREATININE: 1.03 mg/dL (ref 0.76–1.27)
Calcium: 9.3 mg/dL (ref 8.7–10.2)
Chloride: 104 mmol/L (ref 96–106)
GFR calc non Af Amer: 96 mL/min/{1.73_m2} (ref >59)
GFR, EST AFRICAN AMERICAN: 111 mL/min/{1.73_m2} (ref >59)
Glucose: 93 mg/dL (ref 65–99)
Potassium: 4.7 mmol/L (ref 3.5–5.2)
SODIUM: 142 mmol/L (ref 134–144)

## 2017-10-18 LAB — LIPID PANEL
Chol/HDL Ratio: 4.3 ratio (ref 0.0–5.0)
Cholesterol, Total: 225 mg/dL — ABNORMAL HIGH (ref 100–199)
HDL: 52 mg/dL (ref >39)
LDL Calculated: 146 mg/dL — ABNORMAL HIGH (ref 0–99)
TRIGLYCERIDES: 135 mg/dL (ref 0–149)
VLDL CHOLESTEROL CAL: 27 mg/dL (ref 5–40)

## 2017-10-19 DIAGNOSIS — H9193 Unspecified hearing loss, bilateral: Secondary | ICD-10-CM | POA: Insufficient documentation

## 2017-10-19 DIAGNOSIS — L255 Unspecified contact dermatitis due to plants, except food: Secondary | ICD-10-CM | POA: Insufficient documentation

## 2017-10-19 NOTE — Assessment & Plan Note (Signed)
Referral for formal audiology.

## 2017-10-19 NOTE — Assessment & Plan Note (Signed)
I will give him a prescription for oral prednisone for next episode of contact dermatitis has had this many times in the past he has a pretty severe reaction to the think is reasonable to he knows the red flags to watch out for that would need urgent medical attention.

## 2017-10-19 NOTE — Assessment & Plan Note (Signed)
Discussed options and symptoms.  We will start on citalopram.  Follow-up 4 weeks.

## 2017-10-19 NOTE — Assessment & Plan Note (Signed)
Labs today.  He is exercising regularly.  Discussed health maintenance items and healthy lifestyle modifications; in general he is doing very well.

## 2017-10-24 ENCOUNTER — Encounter: Payer: Self-pay | Admitting: Family Medicine

## 2017-12-05 ENCOUNTER — Encounter: Payer: Self-pay | Admitting: Family Medicine

## 2017-12-08 ENCOUNTER — Other Ambulatory Visit: Payer: Self-pay | Admitting: Family Medicine

## 2017-12-08 DIAGNOSIS — F411 Generalized anxiety disorder: Secondary | ICD-10-CM

## 2017-12-11 ENCOUNTER — Other Ambulatory Visit: Payer: Self-pay | Admitting: Family Medicine

## 2017-12-11 DIAGNOSIS — F411 Generalized anxiety disorder: Secondary | ICD-10-CM

## 2017-12-11 MED ORDER — ESCITALOPRAM OXALATE 20 MG PO TABS: 20.0000 mg | ORAL_TABLET | Freq: Every day | ORAL | 3 refills | Status: DC

## 2017-12-11 NOTE — Progress Notes (Signed)
Phone call. Issues with his Dad's health worse. He is tolerating medicine but still feels very on edge Cannot gt in for appointment 2 weeks. Will increase dose SSRI. He requests referral to CBT.

## 2017-12-26 ENCOUNTER — Ambulatory Visit: Payer: 59 | Admitting: Family Medicine

## 2017-12-31 ENCOUNTER — Encounter: Payer: Self-pay | Admitting: Family Medicine

## 2018-01-14 DIAGNOSIS — J020 Streptococcal pharyngitis: Secondary | ICD-10-CM | POA: Diagnosis not present

## 2018-01-14 DIAGNOSIS — J029 Acute pharyngitis, unspecified: Secondary | ICD-10-CM | POA: Diagnosis not present

## 2018-01-21 DIAGNOSIS — H90A21 Sensorineural hearing loss, unilateral, right ear, with restricted hearing on the contralateral side: Secondary | ICD-10-CM | POA: Diagnosis not present

## 2018-01-21 DIAGNOSIS — H903 Sensorineural hearing loss, bilateral: Secondary | ICD-10-CM | POA: Diagnosis not present

## 2018-03-22 ENCOUNTER — Other Ambulatory Visit: Payer: Self-pay | Admitting: Family Medicine

## 2018-04-08 ENCOUNTER — Other Ambulatory Visit: Payer: Self-pay | Admitting: Family Medicine

## 2018-04-08 DIAGNOSIS — L255 Unspecified contact dermatitis due to plants, except food: Secondary | ICD-10-CM

## 2018-07-14 ENCOUNTER — Other Ambulatory Visit: Payer: Self-pay | Admitting: Family Medicine

## 2019-03-28 ENCOUNTER — Other Ambulatory Visit: Payer: Self-pay | Admitting: Family Medicine

## 2020-05-22 ENCOUNTER — Other Ambulatory Visit: Payer: Self-pay | Admitting: Family Medicine

## 2021-09-13 ENCOUNTER — Encounter: Payer: Self-pay | Admitting: *Deleted
